# Patient Record
Sex: Female | Born: 1980 | Race: Black or African American | Hispanic: No | Marital: Single | Smoking: Never smoker
Health system: Southern US, Community
[De-identification: ages and names within clinical notes are randomized; demographics above are authoritative.]

## PROBLEM LIST (undated history)

## (undated) DIAGNOSIS — R5383 Other fatigue: Secondary | ICD-10-CM

## (undated) DIAGNOSIS — R519 Headache, unspecified: Secondary | ICD-10-CM

## (undated) DIAGNOSIS — R51 Headache: Secondary | ICD-10-CM

## (undated) DIAGNOSIS — G35 Multiple sclerosis: Principal | ICD-10-CM

## (undated) DIAGNOSIS — L659 Nonscarring hair loss, unspecified: Secondary | ICD-10-CM

## (undated) HISTORY — DX: Nonscarring hair loss, unspecified: L65.9

## (undated) HISTORY — DX: Headache: R51

## (undated) HISTORY — DX: Multiple sclerosis: G35

## (undated) HISTORY — DX: Headache, unspecified: R51.9

## (undated) HISTORY — PX: OVARY SURGERY: SHX727

## (undated) HISTORY — DX: Other fatigue: R53.83

---

## 2008-10-16 DIAGNOSIS — G35D Multiple sclerosis, unspecified: Secondary | ICD-10-CM

## 2008-10-16 DIAGNOSIS — G35 Multiple sclerosis: Secondary | ICD-10-CM

## 2008-10-16 HISTORY — DX: Multiple sclerosis, unspecified: G35.D

## 2008-10-16 HISTORY — DX: Multiple sclerosis: G35

## 2013-02-14 ENCOUNTER — Encounter: Payer: Self-pay | Admitting: Internal Medicine

## 2013-02-14 ENCOUNTER — Other Ambulatory Visit (INDEPENDENT_AMBULATORY_CARE_PROVIDER_SITE_OTHER): Payer: 59

## 2013-02-14 ENCOUNTER — Ambulatory Visit (INDEPENDENT_AMBULATORY_CARE_PROVIDER_SITE_OTHER): Payer: 59 | Admitting: Internal Medicine

## 2013-02-14 VITALS — BP 140/80 | HR 108 | Temp 97.7°F | Resp 16 | Wt 124.0 lb

## 2013-02-14 DIAGNOSIS — R209 Unspecified disturbances of skin sensation: Secondary | ICD-10-CM

## 2013-02-14 DIAGNOSIS — M6281 Muscle weakness (generalized): Secondary | ICD-10-CM

## 2013-02-14 DIAGNOSIS — R531 Weakness: Secondary | ICD-10-CM | POA: Insufficient documentation

## 2013-02-14 DIAGNOSIS — R202 Paresthesia of skin: Secondary | ICD-10-CM | POA: Insufficient documentation

## 2013-02-14 DIAGNOSIS — G35 Multiple sclerosis: Secondary | ICD-10-CM

## 2013-02-14 DIAGNOSIS — J309 Allergic rhinitis, unspecified: Secondary | ICD-10-CM

## 2013-02-14 DIAGNOSIS — Z91018 Allergy to other foods: Secondary | ICD-10-CM

## 2013-02-14 LAB — URINALYSIS, ROUTINE W REFLEX MICROSCOPIC
Bilirubin Urine: NEGATIVE
Ketones, ur: NEGATIVE
Nitrite: NEGATIVE
Specific Gravity, Urine: >=1.03 — AB (ref 1.000–1.030)
Urine Glucose: NEGATIVE
Urobilinogen, UA: 0.2 (ref 0.0–1.0)
pH: 5.5 (ref 5.0–8.0)

## 2013-02-14 LAB — CBC WITH DIFFERENTIAL/PLATELET
Basophils Absolute: 0 10*3/uL (ref 0.0–0.1)
Basophils Relative: 0.4 % (ref 0.0–3.0)
Eosinophils Absolute: 0 10*3/uL (ref 0.0–0.7)
Eosinophils Relative: 0.4 % (ref 0.0–5.0)
HCT: 39.9 % (ref 36.0–46.0)
Hemoglobin: 12.9 g/dL (ref 12.0–15.0)
Lymphocytes Relative: 39 % (ref 12.0–46.0)
Lymphs Abs: 2.3 10*3/uL (ref 0.7–4.0)
MCHC: 32.3 g/dL (ref 30.0–36.0)
MCV: 96.2 fl (ref 78.0–100.0)
Monocytes Absolute: 0.4 10*3/uL (ref 0.1–1.0)
Monocytes Relative: 7.5 % (ref 3.0–12.0)
Neutro Abs: 3.1 10*3/uL (ref 1.4–7.7)
Neutrophils Relative %: 52.7 % (ref 43.0–77.0)
Platelets: 315 10*3/uL (ref 150.0–400.0)
RBC: 4.15 Mil/uL (ref 3.87–5.11)
RDW: 12.7 % (ref 11.5–14.6)
WBC: 5.8 10*3/uL (ref 4.5–10.5)

## 2013-02-14 LAB — BASIC METABOLIC PANEL
BUN: 13 mg/dL (ref 6–23)
CO2: 23 mEq/L (ref 19–32)
Calcium: 10.1 mg/dL (ref 8.4–10.5)
Chloride: 113 mEq/L — ABNORMAL HIGH (ref 96–112)
Creatinine, Ser: 0.7 mg/dL (ref 0.4–1.2)
GFR: 126.11 mL/min (ref >60.00)
Glucose, Bld: 83 mg/dL (ref 70–99)
Potassium: 4.5 mEq/L (ref 3.5–5.1)
Sodium: 150 mEq/L — ABNORMAL HIGH (ref 135–145)

## 2013-02-14 LAB — SEDIMENTATION RATE: Sed Rate: 22 mm/hr (ref 0–22)

## 2013-02-14 LAB — TSH: TSH: 1.93 u[IU]/mL (ref 0.35–5.50)

## 2013-02-14 LAB — HEPATIC FUNCTION PANEL
ALT: 16 U/L (ref 0–35)
AST: 23 U/L (ref 0–37)
Albumin: 4.5 g/dL (ref 3.5–5.2)
Alkaline Phosphatase: 36 U/L — ABNORMAL LOW (ref 39–117)
Bilirubin, Direct: 0 mg/dL (ref 0.0–0.3)
Total Bilirubin: 0.5 mg/dL (ref 0.3–1.2)
Total Protein: 8.4 g/dL — ABNORMAL HIGH (ref 6.0–8.3)

## 2013-02-14 LAB — VITAMIN B12: Vitamin B-12: 472 pg/mL (ref 211–911)

## 2013-02-14 LAB — MAGNESIUM: Magnesium: 2 mg/dL (ref 1.5–2.5)

## 2013-02-14 MED ORDER — LORATADINE 10 MG PO TABS: 10.0000 mg | ORAL_TABLET | Freq: Every day | ORAL | Status: AC

## 2013-02-14 MED ORDER — CEFUROXIME AXETIL 250 MG PO TABS: 250.0000 mg | ORAL_TABLET | Freq: Two times a day (BID) | ORAL | Status: DC

## 2013-02-14 NOTE — Assessment & Plan Note (Addendum)
MRI Labs Will get records

## 2013-02-14 NOTE — Progress Notes (Signed)
Pre visit review using our clinic review tool, if applicable. No additional management support is needed unless otherwise documented below in the visit note. 

## 2013-02-14 NOTE — Assessment & Plan Note (Signed)
Labs Claritin

## 2013-02-14 NOTE — Assessment & Plan Note (Signed)
[] 

## 2013-02-14 NOTE — Progress Notes (Signed)
   Subjective:    HPI  New pt  L handed 33 yo pt with a h/o relapse-remitting MS c/o R sided body numbness and weakness (chronic - worse a little lately) and  a HA x1 mo - she was in the hosp w/MS x 1 wk in 2010 in [place]   Review of Systems  Constitutional: Positive for fatigue. Negative for fever, chills, diaphoresis, activity change, appetite change and unexpected weight change.  HENT: Positive for congestion and rhinorrhea. Negative for dental problem, ear pain, hearing loss, mouth sores, postnasal drip, sinus pressure, sneezing, sore throat and voice change.   Eyes: Negative for pain and visual disturbance.  Respiratory: Negative for cough, chest tightness, wheezing and stridor.   Cardiovascular: Negative for chest pain, palpitations and leg swelling.  Gastrointestinal: Negative for nausea, vomiting, abdominal pain, diarrhea, blood in stool, abdominal distention and rectal pain.  Genitourinary: Negative for dysuria, hematuria, decreased urine volume, vaginal bleeding, vaginal discharge, difficulty urinating, vaginal pain and menstrual problem.  Musculoskeletal: Negative for back pain, gait problem, joint swelling and neck pain.  Skin: Negative for color change, rash and wound.  Allergic/Immunologic: Positive for environmental allergies. Negative for food allergies and immunocompromised state.  Neurological: Positive for weakness and numbness. Negative for dizziness, tremors, syncope, speech difficulty and light-headedness.  Hematological: Negative for adenopathy.  Psychiatric/Behavioral: Negative for suicidal ideas, hallucinations, behavioral problems, confusion, sleep disturbance, dysphoric mood and decreased concentration. The patient is not nervous/anxious and is not hyperactive.        Objective:   Physical Exam  Constitutional: She is oriented to person, place, and time. She appears well-developed. No distress.  HENT:  Head: Normocephalic.  Right Ear: External ear normal.   Left Ear: External ear normal.  Nose: Nose normal.  Mouth/Throat: Oropharynx is clear and moist.  Eyes: Conjunctivae are normal. Pupils are equal, round, and reactive to light. Right eye exhibits no discharge. Left eye exhibits no discharge.  Neck: Normal range of motion. Neck supple. No JVD present. No tracheal deviation present. No thyromegaly present.  Cardiovascular: Normal rate, regular rhythm and normal heart sounds.   Pulmonary/Chest: No stridor. No respiratory distress. She has no wheezes.  Abdominal: Soft. Bowel sounds are normal. She exhibits no distension and no mass. There is no tenderness. There is no rebound and no guarding.  Musculoskeletal: She exhibits no edema and no tenderness.  Lymphadenopathy:    She has no cervical adenopathy.  Neurological: She is alert and oriented to person, place, and time. She displays normal reflexes. No cranial nerve deficit. She exhibits normal muscle tone. Coordination normal.  R UE and R LE MS 5-/5, grip nl B DTRs  Decreased on R  Skin: No rash noted. She is not diaphoretic. No erythema.  Psychiatric: She has a normal mood and affect. Her behavior is normal. Judgment and thought content normal.          Assessment & Plan:

## 2013-02-14 NOTE — Patient Instructions (Signed)
Gluten free trial (no wheat products) for 4-6 weeks. OK to use gluten-free bread and gluten-free pasta.  Milk free trial (no milk, ice cream, cheese and yogurt) for 4-6 weeks. OK to use almond, coconut, rice or soy milk. "Almond breeze" brand tastes good.  

## 2013-02-14 NOTE — Assessment & Plan Note (Signed)
MRI

## 2013-02-17 LAB — ALLERGEN FOOD PROFILE SPECIFIC IGE
Apple: 0.45 kU/L — ABNORMAL HIGH
Chicken IgE: <0.1 kU/L
Corn: 0.94 kU/L — ABNORMAL HIGH
Egg White IgE: <0.1 kU/L
Fish Cod: <0.1 kU/L
IgE (Immunoglobulin E), Serum: 68.2 IU/mL (ref 0.0–180.0)
Milk IgE: <0.1 kU/L
Orange: 0.55 kU/L — ABNORMAL HIGH
Peanut IgE: 2.8 kU/L — ABNORMAL HIGH
Shrimp IgE: <0.1 kU/L
Soybean IgE: 0.45 kU/L — ABNORMAL HIGH
Tomato IgE: 4.23 kU/L — ABNORMAL HIGH
Tuna IgE: <0.1 kU/L
Wheat IgE: 0.4 kU/L — ABNORMAL HIGH

## 2013-02-17 LAB — ALLERGY PROFILE REGION II-DC, DE, MD, ~~LOC~~, VA
Allergen, D pternoyssinus,d7: <0.1 kU/L
Alternaria Alternata: 1.32 kU/L — ABNORMAL HIGH
Aspergillus fumigatus, m3: 0.38 kU/L — ABNORMAL HIGH
Bermuda Grass: 18.6 kU/L — ABNORMAL HIGH
Box Elder IgE: 16.6 kU/L — ABNORMAL HIGH
Cat Dander: <0.1 kU/L
Cladosporium Herbarum: 0.29 kU/L — ABNORMAL HIGH
Cockroach: <0.1 kU/L
Common Ragweed: 16.9 kU/L — ABNORMAL HIGH
D. farinae: 0.1 kU/L — ABNORMAL HIGH
Dog Dander: <0.1 kU/L
Elm IgE: 17.2 kU/L — ABNORMAL HIGH
Johnson Grass: 5.72 kU/L — ABNORMAL HIGH
Lamb's Quarters: 9.37 kU/L — ABNORMAL HIGH
Meadow Grass: 14.8 kU/L — ABNORMAL HIGH
Oak: 9.87 kU/L — ABNORMAL HIGH
Pecan/Hickory Tree IgE: 11.7 kU/L — ABNORMAL HIGH

## 2013-02-18 DIAGNOSIS — Z91018 Allergy to other foods: Secondary | ICD-10-CM | POA: Insufficient documentation

## 2013-02-18 NOTE — Assessment & Plan Note (Signed)
Discussed diet  

## 2013-02-27 ENCOUNTER — Encounter: Payer: Self-pay | Admitting: Internal Medicine

## 2013-02-27 ENCOUNTER — Ambulatory Visit (HOSPITAL_COMMUNITY): Admission: RE | Admit: 2013-02-27 | Payer: 59 | Source: Ambulatory Visit

## 2013-02-27 ENCOUNTER — Ambulatory Visit (HOSPITAL_COMMUNITY)
Admission: RE | Admit: 2013-02-27 | Discharge: 2013-02-27 | Disposition: A | Discharge status: Home / Self Care | Payer: 59 | Source: Ambulatory Visit | Attending: Internal Medicine | Admitting: Internal Medicine

## 2013-02-27 ENCOUNTER — Other Ambulatory Visit: Payer: Self-pay | Admitting: Internal Medicine

## 2013-02-27 DIAGNOSIS — G35 Multiple sclerosis: Secondary | ICD-10-CM

## 2013-02-27 DIAGNOSIS — R202 Paresthesia of skin: Secondary | ICD-10-CM

## 2013-02-27 DIAGNOSIS — R531 Weakness: Secondary | ICD-10-CM

## 2013-02-27 DIAGNOSIS — R29898 Other symptoms and signs involving the musculoskeletal system: Secondary | ICD-10-CM | POA: Insufficient documentation

## 2013-02-27 MED ORDER — GADOBENATE DIMEGLUMINE 529 MG/ML IV SOLN
11.0000 mL | Freq: Once | INTRAVENOUS | Status: AC | PRN
Start: 2013-02-27 — End: 2013-02-27
  Administered 2013-02-27: 11 mL via INTRAVENOUS

## 2013-03-25 ENCOUNTER — Encounter: Payer: Self-pay | Admitting: *Deleted

## 2013-03-26 ENCOUNTER — Ambulatory Visit (INDEPENDENT_AMBULATORY_CARE_PROVIDER_SITE_OTHER): Payer: 59 | Admitting: Neurology

## 2013-03-26 ENCOUNTER — Encounter: Payer: Self-pay | Admitting: Neurology

## 2013-03-26 VITALS — BP 120/70 | HR 124 | Temp 98.5°F | Ht 64.96 in | Wt 128.2 lb

## 2013-03-26 DIAGNOSIS — G35 Multiple sclerosis: Secondary | ICD-10-CM

## 2013-03-26 NOTE — Patient Instructions (Signed)
1.  Start taking vitamin D 2000units daily 2.  I would like to see you back in 71-months, or sooner as needed

## 2013-03-26 NOTE — Progress Notes (Signed)
a [facility] Clinic Note - Initial Visit   Date: 03/26/2013    Shelby Navarro MRN: [id number] DOB: 02-08-1980   Dear Dr Alain Marion:  Thank you for your kind referral of Shelby Navarro for consultation of multiple sclerosis. Although her history is well known to you, please allow [name] to reiterate it for the purpose of our medical record. The patient was accompanied to the clinic by self.   History of Present Illness: Shelby Navarro is a 33 y.o. left-handed African American female with history of multiple sclerosis (diagnosed 2010) presenting to establish care for MS.  Patient moved to [facility] from [place] in 2013 to be closer to family and has not seen a neurologist since her previous neurologist, Dr. [name], in 2011.  She was diagnosed with MS in 2010 at which time she developed right-sided weakness, incoordination, numbness which had started in the summer and progressed over the next 11-months. She went to the Emergency Department in October because of severe headaches and right arm and leg pain at which time MRI showed two left hemispheric areas of T2 hyperintensity with minimal enhancement and enhancing C2 cord lesion.  She underwent lumbar puncture which showed elevated IgG, myelin basic protein, and presence of oligoclonal bands consistent with multiple sclerosis, and completed 5-day IVMP 1000mg .  She was discharged home and with out-patient therapy and within one month, she returned nearly back to her baseline.  She had mild residual right hand numbness/tingling which resolved several months later.  Subsequent imaging that showed right parietal lesion was larger in size, but remained non-enhancing and the remaining lesions were unchanged.  Because she was clinically stable, immunomodulatory therapy was not started.  She has been doing well since then so has not seen a neurologist.  No history of vision loss/changes, bowel/bladder problems, or weakness.  However,  since early 2015, she developed fatigue, intermittent headaches, and right-sided arm pain.  Pain initially started in her neck and within a few weeks, developed right upper arm throbbing pain and numbness similar to what she experienced in 2010.  She was recently treated for UTI and her symptoms have nearly resolved since completing antibiotics.  MRI brain and cervical spine performed on 02/27/2013 shows no new or enhancing brain lesions.  Out-side paper records, electronic medical record, and images have been reviewed where available and summarized as:  Internal data: MRI brain and cervical spine wwo contrast 02/27/2013: 1. Cerebral white matter disease consistent with history of multiple sclerosis. None of the plaques enhance or restrict diffusion.  2. Single cervical cord plaque, located in the posterior right cord at C2.   External data: MRI brain wwo 05/07/2009:  Changing appearance of demyelinating lesions, worsened lesion in the right parietal lobe, improved lesion in the left posterior frontal was first restricted diffusion, otherwise the periventricular demyelinating changes remain stable. MRI cervical spine with and without contrast 05/07/2009:  See to dorsal column demyelinating plaque, stable size, but no potlucks activity, no enhancement and no new lesions in the cervical or thoracic spinal cord. Labs 12/07/2008:  NMO, ENA -  negative, SPEP - borderline polyclonal gammopathy CSF 11/14/2008:  W3 R <1000 G63, P14  IgG and MPB elevated, OCB +  CSF cytology, ACE, VDRL, ACE - neg   Past Medical History  Diagnosis Date  . Multiple sclerosis 10/2008  . Alopecia   . Headache   . Fatigue     Past Surgical History  Procedure Laterality Date  . Ovary surgery  Medications:  Current Outpatient Prescriptions on File Prior to Visit  Medication Sig Dispense Refill  . loratadine (CLARITIN) 10 MG tablet Take 1 tablet (10 mg total) by mouth daily.  100 tablet  3  . norgestimate-ethinyl  estradiol (ORTHO-CYCLEN,SPRINTEC,PREVIFEM) 0.25-35 MG-MCG tablet Take 1 tablet by mouth daily.       No current facility-administered medications on file prior to visit.    Allergies: No Known Allergies  Family History: Family History  Problem Relation Age of Onset  . Hypertension Mother   . Lupus Sister     Social History: History   Social History  . Marital Status: Single    Spouse Name: N/A    Number of Children: 2  . Years of Education: N/A   Occupational History  .  [employer]   Social History Main Topics  . Smoking status: Never Smoker   . Smokeless tobacco: Not on file  . Alcohol Use: Not on file  . Drug Use: Not on file  . Sexual Activity: Not on file   Other Topics Concern  . Not on file   Social History Narrative  . No narrative on file    Review of Systems:  CONSTITUTIONAL: No fevers, chills, night sweats, or weight loss.  +malaise EYES: No visual changes or eye pain ENT: No hearing changes.  No history of nose bleeds.   RESPIRATORY: No cough, wheezing and shortness of breath.   CARDIOVASCULAR: Negative for chest pain, and palpitations.   GI: Negative for abdominal discomfort, blood in stools or black stools.  No recent change in bowel habits.   GU:  No history of incontinence.   MUSCLOSKELETAL: No history of joint pain or swelling.  No myalgias.   SKIN: Negative for lesions, rash, and itching.   HEMATOLOGY/ONCOLOGY: Negative for prolonged bleeding, bruising easily, and swollen nodes. .   ENDOCRINE: Negative for cold or heat intolerance, polydipsia or goiter.   PSYCH:  No depression or anxiety symptoms.   NEURO: As Above.   Vital Signs:  BP 120/70  Pulse 124  Temp(Src) 98.5 F (36.9 C)  Ht 5' 4.96" (1.65 m)  Wt 128 lb 4 oz (58.174 kg)  BMI 21.37 kg/m2  SpO2 99%  LMP 01/27/2013 Pain Scale: 0 on a scale of 0-10   General Medical Exam:   General:  Well appearing, comfortable.   Eyes/ENT: see cranial nerve examination.   Neck:  No masses appreciated.  Full range of motion without tenderness.  No carotid bruits. Respiratory:  Clear to auscultation, good air entry bilaterally.   Cardiac:  Regular rate and rhythm, no murmur.   Extremities:  No deformities, edema, or skin discoloration. Good capillary refill.   Skin:  Skin color, texture, turgor normal. No rashes or lesions.  Neurological Exam: MENTAL STATUS including orientation to time, place, person, recent and remote memory, attention span and concentration, language, and fund of knowledge is normal.  Speech is not dysarthric.  CRANIAL NERVES: II:  No visual field defects.  Unremarkable fundi, no optic atrophy.   III-IV-VI: Pupils equal round and reactive to light. No APD. Normal conjugate, extra-ocular eye movements in all directions of gaze.  No nystagmus.  No ptosis.   V:  Normal facial sensation.     VII:  Normal facial symmetry and movements.  No pathologic facial reflexes.  VIII:  Normal hearing and vestibular function.   IX-X:  Normal palatal movement.   XI:  Normal shoulder shrug and head rotation.   XII:  Normal  tongue strength and range of motion, no deviation or fasciculation.  MOTOR:  No atrophy, fasciculations or abnormal movements.  No pronator drift.  Tone is normal.    Right Upper Extremity:    Left Upper Extremity:    Deltoid  5/5   Deltoid  5/5   Biceps  5/5   Biceps  5/5   Triceps  5/5   Triceps  5/5   Wrist extensors  5/5   Wrist extensors  5/5   Wrist flexors  5/5   Wrist flexors  5/5   Finger extensors  5/5   Finger extensors  5/5   Finger flexors  5/5   Finger flexors  5/5   Dorsal interossei  5/5   Dorsal interossei  5/5   Abductor pollicis  5/5   Abductor pollicis  5/5   Tone (Ashworth scale)  0  Tone (Ashworth scale)  0   Right Lower Extremity:    Left Lower Extremity:    Hip flexors  5/5   Hip flexors  5/5   Hip extensors  5/5   Hip extensors  5/5   Knee flexors  5/5   Knee flexors  5/5   Knee extensors  5/5   Knee extensors   5/5   Dorsiflexors  5/5   Dorsiflexors  5/5   Plantarflexors  5/5   Plantarflexors  5/5   Toe extensors  5/5   Toe extensors  5/5   Toe flexors  5/5   Toe flexors  5/5   Tone (Ashworth scale)  0  Tone (Ashworth scale)  0   MSRs:  Right                                                                 Left brachioradialis 2+  brachioradialis 2+  biceps 2+  biceps 2+  triceps 2+  triceps 2+  patellar 2+  patellar 3+  ankle jerk 2+  ankle jerk 2+  Hoffman no  Hoffman no  plantar response down  plantar response down   SENSORY:  Normal and symmetric perception of light touch, pinprick, vibration, and proprioception.  Romberg's sign absent.   COORDINATION/GAIT: Normal finger-to- nose-finger and heel-to-shin.  Intact rapid alternating movements bilaterally.  Able to rise from a chair without using arms.  Gait narrow based and stable. Tandem and stressed gait intact.    IMPRESSION: Ms. Doss is a delightful 33 year-old female presenting to establish care for multiple sclerosis.  Her exam is notable for brisk patella jerk on the left only.  Motor strength, sensation, and tone is normal.  I have personally reviewed the images with patient which shows T2 hyperintensity involving the right occipital horn/periventricular, left parietal subcortical region, and cervical cord at C2, none of which are enhancing.  Given that symptoms have improved after completing treatment for UTI, I suspect that her right sided discomfort was due to unmasking of old symptoms from stress related to infection, and not due to MS exacerbation.   If fatigue remains a problem, may consider start provigil going forward.  I explained that I would have a low threshold to start immunomodulatory treatment, if she has new symptoms or lesions on imaging.  She would like to stay off medications at this time, which is reasonable  given her stable course since disease onset.  On a side note, her heart rate is elevated today and was checked  several times by myself (HR 120s).  She is asymptomatic, but will ask Dr. Alain Marion to evaluate.     PLAN/RECOMMENDATIONS:  1.  Start vitamin D 2000 units 2.  Asymptomatic with sinus tachycardia (HR 120s), will ask PCP to evaluate 3.  Return to clinic in 67-months, or sooner as needed   The duration of this appointment visit was 50 minutes of face-to-face time with the patient.  Greater than 50% of this time was spent in counseling, explanation of diagnosis, planning of further management, and coordination of care.   Thank you for allowing me to participate in patient's care.  If I can answer any additional questions, I would be pleased to do so.    Sincerely,    Deriana Vanderhoef K. Posey Pronto, DO

## 2013-03-27 ENCOUNTER — Encounter: Payer: Self-pay | Admitting: Internal Medicine

## 2013-03-28 ENCOUNTER — Other Ambulatory Visit: Payer: Self-pay | Admitting: Internal Medicine

## 2013-03-28 MED ORDER — VITAMIN D 50 MCG (2000 UT) PO TABS: 2000.0000 [IU] | ORAL_TABLET | Freq: Every day | ORAL | Status: AC

## 2013-04-02 ENCOUNTER — Other Ambulatory Visit: Payer: Self-pay | Admitting: Internal Medicine

## 2013-04-02 DIAGNOSIS — R000 Tachycardia, unspecified: Secondary | ICD-10-CM | POA: Insufficient documentation

## 2013-04-02 NOTE — Assessment & Plan Note (Signed)
Card ref - Dr Ross 

## 2013-05-09 ENCOUNTER — Institutional Professional Consult (permissible substitution): Payer: 59 | Admitting: Internal Medicine

## 2013-06-02 ENCOUNTER — Encounter: Payer: Self-pay | Admitting: Internal Medicine

## 2013-06-02 ENCOUNTER — Other Ambulatory Visit: Payer: Self-pay | Admitting: Internal Medicine

## 2013-06-02 MED ORDER — POLYMYXIN B-TRIMETHOPRIM 10000-0.1 UNIT/ML-% OP SOLN: 1.0000 [drp] | Freq: Four times a day (QID) | OPHTHALMIC | Status: DC

## 2013-09-08 ENCOUNTER — Other Ambulatory Visit: Payer: Self-pay | Admitting: Internal Medicine

## 2013-09-08 ENCOUNTER — Encounter: Payer: Self-pay | Admitting: Internal Medicine

## 2013-09-08 MED ORDER — CEFUROXIME AXETIL 250 MG PO TABS: 250.0000 mg | ORAL_TABLET | Freq: Two times a day (BID) | ORAL | Status: DC

## 2013-09-26 ENCOUNTER — Ambulatory Visit: Payer: 59 | Admitting: Neurology

## 2013-09-29 ENCOUNTER — Telehealth: Payer: Self-pay | Admitting: Neurology

## 2013-09-29 NOTE — Telephone Encounter (Signed)
Pt cancelled appt for 09-26-13 and did not resch

## 2015-02-08 MED FILL — MONO-LINYAH 28 TABLET: 0.25-35 | 84 days supply | Qty: 112 | Fill #3

## 2015-06-03 MED FILL — MONO-LINYAH 28 TABLET: 0.25-35 | 21 days supply | Qty: 28 | Fill #0

## 2015-06-04 DIAGNOSIS — Z6821 Body mass index (BMI) 21.0-21.9, adult: Secondary | ICD-10-CM | POA: Diagnosis not present

## 2015-06-04 DIAGNOSIS — Z01419 Encounter for gynecological examination (general) (routine) without abnormal findings: Secondary | ICD-10-CM | POA: Diagnosis not present

## 2015-06-23 DIAGNOSIS — Z0000 Encounter for general adult medical examination without abnormal findings: Secondary | ICD-10-CM | POA: Diagnosis not present

## 2015-07-05 MED FILL — MONO-LINYAH 28 TABLET: 0.25-35 | 84 days supply | Qty: 84 | Fill #0

## 2015-08-03 MED FILL — VALACYCLOVIR HCL 500 MG TAB: 500 | 90 days supply | Qty: 90 | Fill #0

## 2015-09-17 ENCOUNTER — Encounter (HOSPITAL_COMMUNITY): Payer: Self-pay | Admitting: Family Medicine

## 2015-09-17 ENCOUNTER — Ambulatory Visit (HOSPITAL_COMMUNITY)
Admission: EM | Admit: 2015-09-17 | Discharge: 2015-09-17 | Disposition: A | Discharge status: Home / Self Care | Payer: 59 | Attending: Physician Assistant | Admitting: Physician Assistant

## 2015-09-17 DIAGNOSIS — L03115 Cellulitis of right lower limb: Secondary | ICD-10-CM | POA: Diagnosis not present

## 2015-09-17 DIAGNOSIS — T148 Other injury of unspecified body region: Secondary | ICD-10-CM | POA: Diagnosis not present

## 2015-09-17 DIAGNOSIS — W57XXXA Bitten or stung by nonvenomous insect and other nonvenomous arthropods, initial encounter: Secondary | ICD-10-CM | POA: Diagnosis not present

## 2015-09-17 MED ORDER — METHYLPREDNISOLONE ACETATE 80 MG/ML IJ SUSP
INTRAMUSCULAR | Status: AC
  Filled 2015-09-17: qty 1

## 2015-09-17 MED ORDER — METHYLPREDNISOLONE ACETATE 80 MG/ML IJ SUSP
80.0000 mg | Freq: Once | INTRAMUSCULAR | Status: AC
  Administered 2015-09-17: 80 mg via INTRAMUSCULAR

## 2015-09-17 MED ORDER — CEPHALEXIN 500 MG PO CAPS: 500.0000 mg | ORAL_CAPSULE | Freq: Two times a day (BID) | ORAL | 0 refills | Status: AC

## 2015-09-17 MED FILL — CEPHALEXIN 500 MG CAPSULE: 500 | 10 days supply | Qty: 20 | Fill #0

## 2015-09-17 NOTE — Discharge Instructions (Signed)
You are having an inflammatory response to an insect bite. I doubt infection at this point, but with warmth and swelling will cover with an antibiotic. Keep elevated and iced. If worsens or start having symptoms of fevers then go to the ER.

## 2015-09-17 NOTE — ED Provider Notes (Signed)
CSN: [id number]     Arrival date & time 09/17/15  1130 History   First MD Initiated Contact with Patient 09/17/15 1252     Chief Complaint  Patient presents with  . Foot Pain   (Consider location/radiation/quality/duration/timing/severity/associated sxs/prior Treatment) Patient presents with swelling and redness to her right ankle. She remembers getting bit by something Monday while working in the yard. Following the bite her ankle started swelling and has worsened since that time. She was unable to get a shoe on today. No fever or chills.       Past Medical History:  Diagnosis Date  . Alopecia   . Fatigue   . Headache   . Multiple sclerosis ([place]) 10/2008   Past Surgical History:  Procedure Laterality Date  . OVARY SURGERY     Family History  Problem Relation Age of Onset  . Hypertension Mother   . Healthy Father   . Lupus Sister   . Colon cancer Paternal Aunt   . Healthy Son   . Healthy Daughter    Social History  Substance Use Topics  . Smoking status: Never Smoker  . Smokeless tobacco: Never Used  . Alcohol use No   OB History    No data available     Review of Systems  Constitutional: Negative for fever.  Skin: Positive for color change.    Allergies  Review of patient's allergies indicates no known allergies.  Home Medications   Prior to Admission medications   Medication Sig Start Date End Date Taking? Authorizing Provider  cephALEXin (KEFLEX) 500 MG capsule Take 1 capsule (500 mg total) by mouth 2 (two) times daily. 09/17/15   Bjorn Pippin, PA-C  Cholecalciferol (VITAMIN D) 2000 UNITS tablet Take 1 tablet (2,000 Units total) by mouth daily. 03/28/13   Evie Lacks Plotnikov, MD  loratadine (CLARITIN) 10 MG tablet Take 1 tablet (10 mg total) by mouth daily. 02/14/13   Cassandria Anger, MD  norgestimate-ethinyl estradiol (ORTHO-CYCLEN,SPRINTEC,PREVIFEM) 0.25-35 MG-MCG tablet Take 1 tablet by mouth daily.    Historical Provider, MD   Meds Ordered and  Administered this Visit   Medications  methylPREDNISolone acetate (DEPO-MEDROL) injection 80 mg (not administered)    BP 127/88   Pulse 90   Temp 98.2 F (36.8 C) (Oral)   Resp 18   LMP 09/03/2015   SpO2 100%  No data found.   Physical Exam  Constitutional: She is oriented to person, place, and time. She appears well-developed and well-nourished. No distress.  HENT:  Head: Normocephalic and atraumatic.  Pulmonary/Chest: Effort normal.  Neurological: She is alert and oriented to person, place, and time.  Skin: Skin is warm and dry. She is not diaphoretic. There is erythema.  Right ankle with 1 hive and surrounding erythema, warmth and edema. No open areas are noted  Psychiatric: Her behavior is normal.    Urgent Care Course   Clinical Course    Procedures (including critical care time)  Labs Review Labs Reviewed - No data to display  Imaging Review No results found.   Visual Acuity Review  Right Eye Distance:   Left Eye Distance:   Bilateral Distance:    Right Eye Near:   Left Eye Near:    Bilateral Near:         MDM   1. Cellulitis of right lower extremity   2. Insect bite    Cover for infection though less doubtful. Suspect inflammatory response. Treat with IM DepoMedrol 80mg . If worsening signs of  infection go to the ED.     Bjorn Pippin, PA-C 09/17/15 1313

## 2015-09-17 NOTE — ED Triage Notes (Signed)
Pt here for right foot pain and swelling that started after working in the yard Monday. sts she believes she was bit by something. Denies any injury. Pt foot very swollen.

## 2015-09-28 MED FILL — MONO-LINYAH 28 TABLET: 0.25-35 | 84 days supply | Qty: 84 | Fill #1

## 2015-12-20 MED FILL — MONO-LINYAH 28 TABLET: 0.25-35 | 84 days supply | Qty: 84 | Fill #2

## 2016-02-10 MED FILL — VALACYCLOVIR HCL 500 MG TAB: 500 | 90 days supply | Qty: 90 | Fill #1

## 2016-02-19 DIAGNOSIS — H5203 Hypermetropia, bilateral: Secondary | ICD-10-CM | POA: Diagnosis not present

## 2016-03-13 MED FILL — MONO-LINYAH 28 TABLET: 0.25-35 | 84 days supply | Qty: 84 | Fill #3

## 2016-06-07 MED FILL — NORG-ETHIN ESTRA 0.25-0.035: 0.25-35 | 84 days supply | Qty: 84 | Fill #0

## 2016-06-27 DIAGNOSIS — Z01419 Encounter for gynecological examination (general) (routine) without abnormal findings: Secondary | ICD-10-CM | POA: Diagnosis not present

## 2016-07-06 MED FILL — VALACYCLOVIR HCL 500 MG TAB: 500 | 90 days supply | Qty: 90 | Fill #0

## 2016-08-10 DIAGNOSIS — Z0000 Encounter for general adult medical examination without abnormal findings: Secondary | ICD-10-CM | POA: Diagnosis not present

## 2016-08-10 DIAGNOSIS — D259 Leiomyoma of uterus, unspecified: Secondary | ICD-10-CM | POA: Diagnosis not present

## 2016-09-20 ENCOUNTER — Other Ambulatory Visit: Payer: Self-pay | Admitting: Obstetrics and Gynecology

## 2016-09-20 NOTE — Patient Instructions (Signed)
Your procedure is scheduled on:  Wednesday, Sept. 12, 2018  Enter through the [employer] [facility] at:  7:00 AM  Pick up the phone at the desk and dial [phone].  Call this number if you have problems the morning of surgery: [phone].  Remember: Do NOT eat food or drink after:  Midnight Tuesday  Take these medicines the morning of surgery with a SIP OF WATER:  None  Stop ALL herbal medications and Vitamin C at this time  Do NOT smoke the day of surgery.  Do NOT wear jewelry (body piercing), metal hair clips/bobby pins, make-up, artifical eyelashes or nail polish. Do NOT wear lotions, powders, or perfumes.  You may wear deodorant. Do NOT shave for 48 hours prior to surgery. Do NOT bring valuables to the hospital. Contacts, dentures, or bridgework may not be worn into surgery.  Have a responsible adult drive you home and stay with you for 24 hours after your procedure  Bring a copy of your healthcare power of attorney and living will documents.

## 2016-09-20 NOTE — H&P (Signed)
36 y.o. [id number] desires permanent sterilization.    Past Medical History:  Diagnosis Date  . Alopecia   . Fatigue   . Headache   . Multiple sclerosis ([place]) 10/2008   Past Surgical History:  Procedure Laterality Date  . OVARY SURGERY      Social History   Social History  . Marital status: Single    Spouse name: N/A  . Number of children: 2  . Years of education: N/A   Occupational History  . [employer]   Social History Main Topics  . Smoking status: Never Smoker  . Smokeless tobacco: Never Used  . Alcohol use No  . Drug use: No  . Sexual activity: Not on file   Other Topics Concern  . Not on file   Social History Narrative   She lives with two children.   She is working as a Technical brewer.  Originally born in [place] and moved to [place] in 2013.    No current facility-administered medications on file prior to encounter.    Current Outpatient Prescriptions on File Prior to Encounter  Medication Sig Dispense Refill  . cephALEXin (KEFLEX) 500 MG capsule Take 1 capsule (500 mg total) by mouth 2 (two) times daily. 20 capsule 0  . Cholecalciferol (VITAMIN D) 2000 UNITS tablet Take 1 tablet (2,000 Units total) by mouth daily. 100 tablet 3  . loratadine (CLARITIN) 10 MG tablet Take 1 tablet (10 mg total) by mouth daily. 100 tablet 3  . norgestimate-ethinyl estradiol (ORTHO-CYCLEN,SPRINTEC,PREVIFEM) 0.25-35 MG-MCG tablet Take 1 tablet by mouth daily.      No Known Allergies  @VITALS2 @  Lungs: clear to ascultation Cor:  RRR Abdomen:  soft, nontender, nondistended. Ex:  no cords, erythema Pelvic:  NEFG, normal uterus  A:  Desires sterilization with filschie clips.   P: All risks, benefits and alternatives d/w patient and she desires to proceed.   Hyrum Shaneyfelt A

## 2016-09-21 ENCOUNTER — Encounter (HOSPITAL_COMMUNITY): Payer: Self-pay

## 2016-09-21 ENCOUNTER — Encounter (HOSPITAL_COMMUNITY)
Admission: RE | Admit: 2016-09-21 | Discharge: 2016-09-21 | Disposition: A | Discharge status: Home / Self Care | Payer: 59 | Source: Ambulatory Visit | Attending: Obstetrics and Gynecology | Admitting: Obstetrics and Gynecology

## 2016-09-21 DIAGNOSIS — Z01812 Encounter for preprocedural laboratory examination: Secondary | ICD-10-CM | POA: Diagnosis not present

## 2016-09-21 DIAGNOSIS — R531 Weakness: Secondary | ICD-10-CM | POA: Insufficient documentation

## 2016-09-21 DIAGNOSIS — J309 Allergic rhinitis, unspecified: Secondary | ICD-10-CM | POA: Diagnosis not present

## 2016-09-21 DIAGNOSIS — R000 Tachycardia, unspecified: Secondary | ICD-10-CM | POA: Insufficient documentation

## 2016-09-21 DIAGNOSIS — G35 Multiple sclerosis: Secondary | ICD-10-CM | POA: Diagnosis not present

## 2016-09-21 DIAGNOSIS — Z91018 Allergy to other foods: Secondary | ICD-10-CM | POA: Diagnosis not present

## 2016-09-21 DIAGNOSIS — R202 Paresthesia of skin: Secondary | ICD-10-CM | POA: Insufficient documentation

## 2016-09-21 LAB — CBC
HCT: 38 % (ref 36.0–46.0)
Hemoglobin: 12.8 g/dL (ref 12.0–15.0)
MCH: 31.1 pg (ref 26.0–34.0)
MCHC: 33.7 g/dL (ref 30.0–36.0)
MCV: 92.5 fL (ref 78.0–100.0)
Platelets: 300 10*3/uL (ref 150–400)
RBC: 4.11 MIL/uL (ref 3.87–5.11)
RDW: 12.4 % (ref 11.5–15.5)
WBC: 4.5 10*3/uL (ref 4.0–10.5)

## 2016-09-26 NOTE — Anesthesia Preprocedure Evaluation (Addendum)
Anesthesia Evaluation  Patient identified by MRN, date of birth, ID band Patient awake    Reviewed: Allergy & Precautions, NPO status , Patient's Chart, lab work & pertinent test results  History of Anesthesia Complications Negative for: history of anesthetic complications  Airway Mallampati: II  TM Distance: >3 FB Neck ROM: Full    Dental no notable dental hx. (+) Dental Advisory Given   Pulmonary neg pulmonary ROS,    Pulmonary exam normal        Cardiovascular negative cardio ROS Normal cardiovascular exam     Neuro/Psych MS negative psych ROS   GI/Hepatic negative GI ROS, Neg liver ROS,   Endo/Other  negative endocrine ROS  Renal/GU negative Renal ROS  negative genitourinary   Musculoskeletal negative musculoskeletal ROS (+)   Abdominal   Peds negative pediatric ROS (+)  Hematology negative hematology ROS (+)   Anesthesia Other Findings   Reproductive/Obstetrics negative OB ROS                            Anesthesia Physical Anesthesia Plan  ASA: II  Anesthesia Plan: General   Post-op Pain Management:    Induction: Intravenous  PONV Risk Score and Plan: 3 and Ondansetron, Dexamethasone and Scopolamine patch - Pre-op  Airway Management Planned: Oral ETT  Additional Equipment:   Intra-op Plan:   Post-operative Plan: Extubation in OR  Informed Consent: I have reviewed the patients History and Physical, chart, labs and discussed the procedure including the risks, benefits and alternatives for the proposed anesthesia with the patient or authorized representative who has indicated his/her understanding and acceptance.   Dental advisory given  Plan Discussed with: CRNA, Anesthesiologist and Surgeon  Anesthesia Plan Comments:        Anesthesia Quick Evaluation

## 2016-09-27 ENCOUNTER — Encounter (HOSPITAL_COMMUNITY): Payer: Self-pay

## 2016-09-27 ENCOUNTER — Ambulatory Visit (HOSPITAL_COMMUNITY): Payer: 59 | Admitting: Anesthesiology

## 2016-09-27 ENCOUNTER — Ambulatory Visit (HOSPITAL_COMMUNITY)
Admission: RE | Admit: 2016-09-27 | Discharge: 2016-09-27 | Disposition: A | Discharge status: Home / Self Care | Payer: 59 | Source: Ambulatory Visit | Attending: Obstetrics and Gynecology | Admitting: Obstetrics and Gynecology

## 2016-09-27 ENCOUNTER — Encounter (HOSPITAL_COMMUNITY)
Admission: RE | Disposition: A | Discharge status: Home / Self Care | Payer: Self-pay | Source: Ambulatory Visit | Attending: Obstetrics and Gynecology

## 2016-09-27 DIAGNOSIS — Z79899 Other long term (current) drug therapy: Secondary | ICD-10-CM | POA: Insufficient documentation

## 2016-09-27 DIAGNOSIS — G35 Multiple sclerosis: Secondary | ICD-10-CM | POA: Diagnosis not present

## 2016-09-27 DIAGNOSIS — Z302 Encounter for sterilization: Secondary | ICD-10-CM | POA: Diagnosis not present

## 2016-09-27 HISTORY — PX: LAPAROSCOPIC TUBAL LIGATION: SHX1937

## 2016-09-27 LAB — PREGNANCY, URINE: Preg Test, Ur: NEGATIVE

## 2016-09-27 SURGERY — LIGATION, FALLOPIAN TUBE, LAPAROSCOPIC
Anesthesia: General | Site: Abdomen | Laterality: Bilateral

## 2016-09-27 MED ORDER — SODIUM CHLORIDE 0.9 % IJ SOLN
INTRAMUSCULAR | Status: AC
  Filled 2016-09-27: qty 50

## 2016-09-27 MED ORDER — LIDOCAINE HCL (CARDIAC) 20 MG/ML IV SOLN
INTRAVENOUS | Status: DC | PRN
  Administered 2016-09-27: 40 mg via INTRAVENOUS

## 2016-09-27 MED ORDER — DEXAMETHASONE SODIUM PHOSPHATE 10 MG/ML IJ SOLN
INTRAMUSCULAR | Status: AC
  Filled 2016-09-27: qty 1

## 2016-09-27 MED ORDER — SUGAMMADEX SODIUM 200 MG/2ML IV SOLN
INTRAVENOUS | Status: AC
  Filled 2016-09-27: qty 2

## 2016-09-27 MED ORDER — SUGAMMADEX SODIUM 200 MG/2ML IV SOLN
INTRAVENOUS | Status: DC | PRN
  Administered 2016-09-27: 130 mg via INTRAVENOUS

## 2016-09-27 MED ORDER — SODIUM CHLORIDE 0.9 % IV SOLN
INTRAVENOUS | Status: DC | PRN
  Administered 2016-09-27: 60 mL

## 2016-09-27 MED ORDER — OXYCODONE-ACETAMINOPHEN 5-325 MG PO TABS: 1.0000 | ORAL_TABLET | ORAL | 0 refills | Status: AC | PRN

## 2016-09-27 MED ORDER — ROCURONIUM BROMIDE 100 MG/10ML IV SOLN
INTRAVENOUS | Status: DC | PRN
  Administered 2016-09-27: 30 mg via INTRAVENOUS

## 2016-09-27 MED ORDER — MIDAZOLAM HCL 2 MG/2ML IJ SOLN
INTRAMUSCULAR | Status: AC
  Filled 2016-09-27: qty 2

## 2016-09-27 MED ORDER — LIDOCAINE HCL (CARDIAC) 20 MG/ML IV SOLN
INTRAVENOUS | Status: AC
  Filled 2016-09-27: qty 5

## 2016-09-27 MED ORDER — OXYCODONE-ACETAMINOPHEN 5-325 MG PO TABS
ORAL_TABLET | ORAL | Status: AC
  Filled 2016-09-27: qty 1

## 2016-09-27 MED ORDER — MIDAZOLAM HCL 2 MG/2ML IJ SOLN
INTRAMUSCULAR | Status: DC | PRN
  Administered 2016-09-27: 1 mg via INTRAVENOUS

## 2016-09-27 MED ORDER — PROMETHAZINE HCL 25 MG/ML IJ SOLN
INTRAMUSCULAR | Status: AC
  Filled 2016-09-27: qty 1

## 2016-09-27 MED ORDER — SCOPOLAMINE 1 MG/3DAYS TD PT72
MEDICATED_PATCH | TRANSDERMAL | Status: AC
  Administered 2016-09-27: 1.5 mg via TRANSDERMAL
  Filled 2016-09-27: qty 1

## 2016-09-27 MED ORDER — ONDANSETRON HCL 4 MG/2ML IJ SOLN
INTRAMUSCULAR | Status: AC
  Filled 2016-09-27: qty 2

## 2016-09-27 MED ORDER — LACTATED RINGERS IV SOLN
INTRAVENOUS | Status: DC
  Administered 2016-09-27 (×2): via INTRAVENOUS

## 2016-09-27 MED ORDER — PROPOFOL 10 MG/ML IV BOLUS
INTRAVENOUS | Status: AC
  Filled 2016-09-27: qty 20

## 2016-09-27 MED ORDER — FENTANYL CITRATE (PF) 100 MCG/2ML IJ SOLN
INTRAMUSCULAR | Status: DC | PRN
  Administered 2016-09-27 (×2): 25 ug via INTRAVENOUS
  Administered 2016-09-27 (×2): 50 ug via INTRAVENOUS

## 2016-09-27 MED ORDER — ONDANSETRON HCL 4 MG/2ML IJ SOLN
INTRAMUSCULAR | Status: DC | PRN
  Administered 2016-09-27: 4 mg via INTRAVENOUS

## 2016-09-27 MED ORDER — PROMETHAZINE HCL 25 MG/ML IJ SOLN
6.2500 mg | INTRAMUSCULAR | Status: DC | PRN
  Administered 2016-09-27: 6.25 mg via INTRAVENOUS

## 2016-09-27 MED ORDER — KETOROLAC TROMETHAMINE 30 MG/ML IJ SOLN
INTRAMUSCULAR | Status: DC | PRN
  Administered 2016-09-27: 30 mg via INTRAVENOUS

## 2016-09-27 MED ORDER — ROCURONIUM BROMIDE 100 MG/10ML IV SOLN
INTRAVENOUS | Status: AC
  Filled 2016-09-27: qty 1

## 2016-09-27 MED ORDER — LACTATED RINGERS IV SOLN
INTRAVENOUS | Status: DC
  Administered 2016-09-27: 07:00:00 via INTRAVENOUS

## 2016-09-27 MED ORDER — KETOROLAC TROMETHAMINE 30 MG/ML IJ SOLN
INTRAMUSCULAR | Status: AC
  Filled 2016-09-27: qty 1

## 2016-09-27 MED ORDER — FENTANYL CITRATE (PF) 250 MCG/5ML IJ SOLN
INTRAMUSCULAR | Status: AC
  Filled 2016-09-27: qty 5

## 2016-09-27 MED ORDER — HYDROMORPHONE HCL 1 MG/ML IJ SOLN: 0.2500 mg | INTRAMUSCULAR | Status: DC | PRN

## 2016-09-27 MED ORDER — ROPIVACAINE HCL 5 MG/ML IJ SOLN
INTRAMUSCULAR | Status: AC
  Filled 2016-09-27: qty 30

## 2016-09-27 MED ORDER — PROPOFOL 10 MG/ML IV BOLUS
INTRAVENOUS | Status: DC | PRN
  Administered 2016-09-27: 30 mg via INTRAVENOUS
  Administered 2016-09-27: 120 mg via INTRAVENOUS

## 2016-09-27 MED ORDER — SCOPOLAMINE 1 MG/3DAYS TD PT72
1.0000 | MEDICATED_PATCH | Freq: Once | TRANSDERMAL | Status: DC
  Administered 2016-09-27: 1.5 mg via TRANSDERMAL

## 2016-09-27 MED ORDER — DEXAMETHASONE SODIUM PHOSPHATE 10 MG/ML IJ SOLN
INTRAMUSCULAR | Status: DC | PRN
  Administered 2016-09-27: 8 mg via INTRAVENOUS

## 2016-09-27 MED ORDER — OXYCODONE-ACETAMINOPHEN 5-325 MG PO TABS
1.0000 | ORAL_TABLET | ORAL | Status: DC | PRN
  Administered 2016-09-27: 1 via ORAL

## 2016-09-27 SURGICAL SUPPLY — 26 items
CATH ROBINSON RED A/P 16FR (CATHETERS) ×4 IMPLANT
CLIP FILSHIE TUBAL LIGA STRL (Clip) ×2 IMPLANT
CLOTH BEACON ORANGE TIMEOUT ST (SAFETY) ×2 IMPLANT
DERMABOND ADVANCED (GAUZE/BANDAGES/DRESSINGS) ×1
DERMABOND ADVANCED .7 DNX12 (GAUZE/BANDAGES/DRESSINGS) ×1 IMPLANT
DRSG OPSITE POSTOP 3X4 (GAUZE/BANDAGES/DRESSINGS) ×2 IMPLANT
DURAPREP 26ML APPLICATOR (WOUND CARE) ×2 IMPLANT
GLOVE BIO SURGEON STRL SZ7 (GLOVE) ×4 IMPLANT
GLOVE BIOGEL PI IND STRL 7.0 (GLOVE) ×1 IMPLANT
GLOVE BIOGEL PI INDICATOR 7.0 (GLOVE) ×1
GOWN STRL REUS W/TWL LRG LVL3 (GOWN DISPOSABLE) ×6 IMPLANT
NEEDLE INSUFFLATION 120MM (ENDOMECHANICALS) ×2 IMPLANT
PACK LAPAROSCOPY BASIN (CUSTOM PROCEDURE TRAY) ×2 IMPLANT
PACK TRENDGUARD 450 HYBRID PRO (MISCELLANEOUS) ×1 IMPLANT
PACK TRENDGUARD 600 HYBRD PROC (MISCELLANEOUS) IMPLANT
PROTECTOR NERVE ULNAR (MISCELLANEOUS) ×2 IMPLANT
SUT VIC AB 2-0 UR6 27 (SUTURE) ×2 IMPLANT
SUT VICRYL RAPIDE 3 0 (SUTURE) IMPLANT
SYR 50ML LL SCALE MARK (SYRINGE) ×2 IMPLANT
SYR 5ML LL (SYRINGE) ×2 IMPLANT
TOWEL OR 17X24 6PK STRL BLUE (TOWEL DISPOSABLE) ×4 IMPLANT
TRENDGUARD 450 HYBRID PRO PACK (MISCELLANEOUS) ×2
TRENDGUARD 600 HYBRID PROC PK (MISCELLANEOUS)
TROCAR OPTI TIP 5M 100M (ENDOMECHANICALS) IMPLANT
TROCAR XCEL DIL TIP R 11M (ENDOMECHANICALS) ×2 IMPLANT
WARMER LAPAROSCOPE (MISCELLANEOUS) ×2 IMPLANT

## 2016-09-27 NOTE — Anesthesia Postprocedure Evaluation (Signed)
Anesthesia Post Note  Patient: Shelby Navarro  Procedure(s) Performed: Procedure(s) (LRB): LAPAROSCOPIC TUBAL LIGATION (Bilateral)     Patient location during evaluation: PACU Anesthesia Type: General Level of consciousness: sedated Pain management: pain level controlled Vital Signs Assessment: post-procedure vital signs reviewed and stable Respiratory status: spontaneous breathing and respiratory function stable Cardiovascular status: stable Anesthetic complications: no    Last Vitals:  Vitals:   09/27/16 1030 09/27/16 1117  BP:  122/60  Pulse: 69 77  Resp: 13 16  Temp: 37.2 C 37.2 C  SpO2: 99% 100%    Last Pain:  Vitals:   09/27/16 1117  TempSrc:   PainSc: 5    Pain Goal: Patients Stated Pain Goal: 3 (09/27/16 1117)               [place]

## 2016-09-27 NOTE — Progress Notes (Signed)
There has been no change in the patients history, status or exam since the history and physical.  Vitals:   09/27/16 0708  BP: 115/73  Pulse: 92  Resp: 18  Temp: 97.9 F (36.6 C)  TempSrc: Oral  SpO2: 100%    Lab Results  Component Value Date   WBC 4.5 09/21/2016   HGB 12.8 09/21/2016   HCT 38.0 09/21/2016   MCV 92.5 09/21/2016   PLT 300 09/21/2016   Results for orders placed or performed during the hospital encounter of 09/27/16 (from the past 24 hour(s))  Pregnancy, urine     Status: None   Collection Time: 09/27/16  7:00 AM  Result Value Ref Range   Preg Test, Ur NEGATIVE NEGATIVE    Emile Kyllo A

## 2016-09-27 NOTE — Transfer of Care (Signed)
Immediate Anesthesia Transfer of Care Note  Patient: Shelby Navarro  Procedure(s) Performed: Procedure(s) with comments: LAPAROSCOPIC TUBAL LIGATION (Bilateral) - WITH FILSHIE CLIPS  Patient Location: PACU  Anesthesia Type:General  Level of Consciousness: awake, alert , oriented and patient cooperative  Airway & Oxygen Therapy: Patient Spontanous Breathing and Patient connected to nasal cannula oxygen  Post-op Assessment: Report given to RN and Post -op Vital signs reviewed and stable  Post vital signs: Reviewed and stable  Last Vitals:  Vitals:   09/27/16 0708  BP: 115/73  Pulse: 92  Resp: 18  Temp: 36.6 C  SpO2: 100%    Last Pain:  Vitals:   09/27/16 0708  TempSrc: Oral      Patients Stated Pain Goal: 3 (36/46/80 3212)  Complications: No apparent anesthesia complications

## 2016-09-27 NOTE — Anesthesia Procedure Notes (Signed)
Procedure Name: Intubation Date/Time: 09/27/2016 8:34 AM Performed by: Georgeanne Nim Pre-anesthesia Checklist: Patient identified, Emergency Drugs available, Suction available, Patient being monitored and Timeout performed Patient Re-evaluated:Patient Re-evaluated prior to induction Oxygen Delivery Method: Circle system utilized Preoxygenation: Pre-oxygenation with 100% oxygen Induction Type: IV induction Ventilation: Mask ventilation without difficulty Laryngoscope Size: Mac and 3 Grade View: Grade I Tube type: Oral Tube size: 7.0 mm Number of attempts: 1 Airway Equipment and Method: Stylet Placement Confirmation: ETT inserted through vocal cords under direct vision,  positive ETCO2,  CO2 detector and breath sounds checked- equal and bilateral Secured at: 20 cm Tube secured with: Tape Dental Injury: Teeth and Oropharynx as per pre-operative assessment

## 2016-09-27 NOTE — Op Note (Signed)
09/27/2016  9:00 AM  PATIENT:  Shelby Navarro  36 y.o. female  PRE-OPERATIVE DIAGNOSIS:  DESIRES STERILIZATION  POST-OPERATIVE DIAGNOSIS:  DESIRES STERILIZATION  PROCEDURE:  Procedure(s) with comments: LAPAROSCOPIC TUBAL LIGATION (Bilateral) - WITH FILSHIE CLIPS  SURGEON:  Surgeon(s) and Role:    * Bobbye Charleston, MD - Primary    ANESTHESIA:   general  EBL:  No intake/output data recorded.  LOCAL MEDICATIONS USED:  OTHER Ropivicaine  SPECIMEN:  No Specimen  DISPOSITION OF SPECIMEN:  N/A  COUNTS:  YES  TOURNIQUET:  * No tourniquets in log *  DICTATION: .Note written in EPIC  PLAN OF CARE: Discharge to home after PACU  PATIENT DISPOSITION:  PACU - hemodynamically stable.   Delay start of Pharmacological VTE agent (>24hrs) due to surgical blood loss or risk of bleeding: not applicable  Complications:  none  Findings: bulky uterus with multiple fibroids.  Filmy adhesion of R broad ligament to both uterus and appendix but appendix otherwise free and normal appearing.  Normal ovaries.    Technique  After adequate general endotracheal anesthesia was achieved, the patient was prepped and draped in the usual sterile fashion.  A 2 cm incision was made just below the umbilicus and the abdominal wall tented up.  The Veress needle was inserted at a 45 degree angle to the pelvis and no bowel contents or blood were aspirated.  The abdomen was insufflated and the 12 mm trocar placed without complication.  The operative scope was introduced and the above findings noted.  The filchie clip instrument was introduced and the tube were followed to their fimbriated ends bilaterally.  A filchie clip was placed on the isthmic portion of each tube and confirmed visually to be around the entire circumference of each tube.  After a quick inspection of the pelvis and liver edge, all instruments were removed and the abdomen was desufflated.  The 12 mm faschial incision was closed with a figure  of eight stitch of 2-vicryl and the skin was closed with dermabond.  All instruments were removed from the vagina and the patient returned to the PACU in stable condition.  Henry Utsey A

## 2016-09-27 NOTE — Discharge Instructions (Addendum)
DISCHARGE INSTRUCTIONS: Laparoscopy  The following instructions have been prepared to help you care for yourself upon your return home today.  Wound care:  Do not get the incision wet for the first 24 hours. The incision should be kept clean and dry.  The Band-Aids or dressings may be removed the Day after surgery.  Should the incision become sore, red, and swollen after the first week, check with your doctor.  Personal hygiene:  Shower the day after your procedure.  Activity and limitations:  Do NOT drive or operate any equipment today.  Do NOT lift anything more than 15 pounds for 2-3 weeks after surgery.  Do NOT rest in bed all day.  Walking is encouraged. Walk each day, starting slowly with 5-minute walks 3 or 4 times a day. Slowly increase the length of your walks.  Walk up and down stairs slowly.  Do NOT do strenuous activities, such as golfing, playing tennis, bowling, running, biking, weight lifting, gardening, mowing, or vacuuming for 2-4 weeks. Ask your doctor when it is okay to start.  Diet: Eat a light meal as desired this evening. You may resume your usual diet tomorrow.  Return to work: This is dependent on the type of work you do. For the most part you can return to a desk job within a week of surgery. If you are more active at work, please discuss this with your doctor.  What to expect after your surgery: You may have a slight burning sensation when you urinate on the first day. You may have a very small amount of blood in the urine. Expect to have a small amount of vaginal discharge/light bleeding for 1-2 weeks. It is not unusual to have abdominal soreness and bruising for up to 2 weeks. You may be tired and need more rest for about 1 week. You may experience shoulder pain for 24-72 hours. Lying flat in bed may relieve it.  Call your doctor for any of the following:  Develop a fever of 100.4 or greater  Inability to urinate 6 hours after discharge from  hospital  Severe pain not relieved by pain medications  Persistent of heavy bleeding at incision site  Redness or swelling around incision site after a week  Increasing nausea or vomiting  Patient Signature________________________________________ Nurse Signature_________________________________________   Post Anesthesia Home Care Instructions  NO IBUPROFEN PRODUCTS UNTIL: 3 PM TODAY  Activity: Get plenty of rest for the remainder of the day. A responsible individual must stay with you for 24 hours following the procedure.  For the next 24 hours, DO NOT: -Drive a car -Paediatric nurse -Drink alcoholic beverages -Take any medication unless instructed by your physician -Make any legal decisions or sign important papers.  Meals: Start with liquid foods such as gelatin or soup. Progress to regular foods as tolerated. Avoid greasy, spicy, heavy foods. If nausea and/or vomiting occur, drink only clear liquids until the nausea and/or vomiting subsides. Call your physician if vomiting continues.  Special Instructions/Symptoms: Your throat may feel dry or sore from the anesthesia or the breathing tube placed in your throat during surgery. If this causes discomfort, gargle with warm salt water. The discomfort should disappear within 24 hours.  If you had a scopolamine patch placed behind your ear for the management of post- operative nausea and/or vomiting:  1. The medication in the patch is effective for 72 hours, after which it should be removed.  Wrap patch in a tissue and discard in the trash. Wash hands thoroughly  with soap and water. 2. You may remove the patch earlier than 72 hours if you experience unpleasant side effects which may include dry mouth, dizziness or visual disturbances. 3. Avoid touching the patch. Wash your hands with soap and water after contact with the patch.

## 2016-09-27 NOTE — Brief Op Note (Signed)
09/27/2016  9:00 AM  PATIENT:  Shelby Navarro  36 y.o. female  PRE-OPERATIVE DIAGNOSIS:  DESIRES STERILIZATION  POST-OPERATIVE DIAGNOSIS:  DESIRES STERILIZATION  PROCEDURE:  Procedure(s) with comments: LAPAROSCOPIC TUBAL LIGATION (Bilateral) - WITH FILSHIE CLIPS  SURGEON:  Surgeon(s) and Role:    * Bobbye Charleston, MD - Primary    ANESTHESIA:   general  EBL:  No intake/output data recorded.  LOCAL MEDICATIONS USED:  OTHER Ropivicaine  SPECIMEN:  No Specimen  DISPOSITION OF SPECIMEN:  N/A  COUNTS:  YES  TOURNIQUET:  * No tourniquets in log *  DICTATION: .Note written in EPIC  PLAN OF CARE: Discharge to home after PACU  PATIENT DISPOSITION:  PACU - hemodynamically stable.   Delay start of Pharmacological VTE agent (>24hrs) due to surgical blood loss or risk of bleeding: not applicable  Complications:  none  Findings: bulky uterus with multiple fibroids.  Filmy adhesion of R broad ligament to both uterus and appendix but appendix otherwise free and normal appearing.  Normal ovaries.    Technique  After adequate general endotracheal anesthesia was achieved, the patient was prepped and draped in the usual sterile fashion.  A 2 cm incision was made just below the umbilicus and the abdominal wall tented up.  The Veress needle was inserted at a 45 degree angle to the pelvis and no bowel contents or blood were aspirated.  The abdomen was insufflated and the 12 mm trocar placed without complication.  The operative scope was introduced and the above findings noted.  The filchie clip instrument was introduced and the tube were followed to their fimbriated ends bilaterally.  A filchie clip was placed on the isthmic portion of each tube and confirmed visually to be around the entire circumference of each tube.  After a quick inspection of the pelvis and liver edge, all instruments were removed and the abdomen was desufflated.  The 12 mm faschial incision was closed with a figure  of eight stitch of 2-vicryl and the skin was closed with dermabond.  All instruments were removed from the vagina and the patient returned to the PACU in stable condition.  Heike Pounds A

## 2016-09-28 ENCOUNTER — Encounter (HOSPITAL_COMMUNITY): Payer: Self-pay | Admitting: Obstetrics and Gynecology

## 2017-06-29 DIAGNOSIS — Z124 Encounter for screening for malignant neoplasm of cervix: Secondary | ICD-10-CM | POA: Diagnosis not present

## 2017-06-29 DIAGNOSIS — R87612 Low grade squamous intraepithelial lesion on cytologic smear of cervix (LGSIL): Secondary | ICD-10-CM | POA: Diagnosis not present

## 2017-06-29 DIAGNOSIS — Z113 Encounter for screening for infections with a predominantly sexual mode of transmission: Secondary | ICD-10-CM | POA: Diagnosis not present

## 2017-06-29 DIAGNOSIS — Z01419 Encounter for gynecological examination (general) (routine) without abnormal findings: Secondary | ICD-10-CM | POA: Diagnosis not present

## 2017-06-29 DIAGNOSIS — Z0000 Encounter for general adult medical examination without abnormal findings: Secondary | ICD-10-CM | POA: Diagnosis not present

## 2017-08-03 DIAGNOSIS — R87612 Low grade squamous intraepithelial lesion on cytologic smear of cervix (LGSIL): Secondary | ICD-10-CM | POA: Diagnosis not present

## 2017-09-18 MED FILL — VALACYCLOVIR HCL 500 MG TAB: 500 | 90 days supply | Qty: 90 | Fill #0

## 2017-10-01 DIAGNOSIS — H5213 Myopia, bilateral: Secondary | ICD-10-CM | POA: Diagnosis not present

## 2017-10-24 MED FILL — FLUCONAZOLE 150 MG TABS: 150 | 4 days supply | Qty: 2 | Fill #0

## 2017-11-01 DIAGNOSIS — N760 Acute vaginitis: Secondary | ICD-10-CM | POA: Diagnosis not present

## 2017-11-01 DIAGNOSIS — Z113 Encounter for screening for infections with a predominantly sexual mode of transmission: Secondary | ICD-10-CM | POA: Diagnosis not present

## 2018-02-14 DIAGNOSIS — R87612 Low grade squamous intraepithelial lesion on cytologic smear of cervix (LGSIL): Secondary | ICD-10-CM | POA: Diagnosis not present

## 2018-02-19 MED FILL — VALACYCLOVIR HCL 500 MG TAB: 500 | 90 days supply | Qty: 90 | Fill #1

## 2018-02-26 MED FILL — FLUCONAZOLE 150 MG TABS: 150 | 3 days supply | Qty: 2 | Fill #0

## 2018-07-18 DIAGNOSIS — Z124 Encounter for screening for malignant neoplasm of cervix: Secondary | ICD-10-CM | POA: Diagnosis not present

## 2018-07-18 DIAGNOSIS — Z01419 Encounter for gynecological examination (general) (routine) without abnormal findings: Secondary | ICD-10-CM | POA: Diagnosis not present

## 2018-07-18 MED FILL — IBUPROFEN 800 MG TAB: 800 | 33 days supply | Qty: 100 | Fill #0

## 2018-07-18 MED FILL — CLINDAMYCIN PH 1% GEL: 1 | 30 days supply | Qty: 60 | Fill #0

## 2019-03-27 ENCOUNTER — Encounter (HOSPITAL_COMMUNITY): Payer: Self-pay

## 2019-03-27 ENCOUNTER — Emergency Department (HOSPITAL_COMMUNITY)
Admission: EM | Admit: 2019-03-27 | Discharge: 2019-03-27 | Disposition: A | Discharge status: Home / Self Care | Payer: No Typology Code available for payment source | Attending: Emergency Medicine | Admitting: Emergency Medicine

## 2019-03-27 ENCOUNTER — Emergency Department (HOSPITAL_COMMUNITY): Payer: No Typology Code available for payment source

## 2019-03-27 DIAGNOSIS — Y998 Other external cause status: Secondary | ICD-10-CM | POA: Insufficient documentation

## 2019-03-27 DIAGNOSIS — Y929 Unspecified place or not applicable: Secondary | ICD-10-CM | POA: Insufficient documentation

## 2019-03-27 DIAGNOSIS — S5012XA Contusion of left forearm, initial encounter: Secondary | ICD-10-CM | POA: Diagnosis not present

## 2019-03-27 DIAGNOSIS — S40022A Contusion of left upper arm, initial encounter: Secondary | ICD-10-CM | POA: Diagnosis not present

## 2019-03-27 DIAGNOSIS — S7002XA Contusion of left hip, initial encounter: Secondary | ICD-10-CM | POA: Diagnosis not present

## 2019-03-27 DIAGNOSIS — Y9301 Activity, walking, marching and hiking: Secondary | ICD-10-CM | POA: Insufficient documentation

## 2019-03-27 DIAGNOSIS — W19XXXA Unspecified fall, initial encounter: Secondary | ICD-10-CM | POA: Insufficient documentation

## 2019-03-27 DIAGNOSIS — S79912A Unspecified injury of left hip, initial encounter: Secondary | ICD-10-CM | POA: Diagnosis not present

## 2019-03-27 DIAGNOSIS — M79603 Pain in arm, unspecified: Secondary | ICD-10-CM | POA: Diagnosis not present

## 2019-03-27 DIAGNOSIS — R52 Pain, unspecified: Secondary | ICD-10-CM | POA: Diagnosis not present

## 2019-03-27 DIAGNOSIS — Z79899 Other long term (current) drug therapy: Secondary | ICD-10-CM | POA: Diagnosis not present

## 2019-03-27 NOTE — ED Notes (Signed)
  Patient advised that she needed a pregnancy test to get her x-rays.  Patient states she had tubal ligation in 2017 and is not sexually active.  Patient politely refuses pregnancy test.  Dr. Betsey Holiday and Rad tech Lauren notified.

## 2019-03-27 NOTE — ED Provider Notes (Signed)
[facility] EMERGENCY DEPARTMENT Provider Note   CSN: [id number] Arrival date & time: 03/27/19  0405     History Chief Complaint  Patient presents with  . Fall    Shelby Navarro is a 39 y.o. female.  Patient presents to the emergency department for evaluation after a fall.  Patient had a ground-level fall while walking.  She landed on her left side, predominantly on the left arm.  She did not hit her head or lose consciousness.  No neck pain, back pain, headache.  Patient complaining of pain from the upper arm down to the wrist as well as left hip pain.        Past Medical History:  Diagnosis Date  . Alopecia   . Fatigue   . Headache   . Multiple sclerosis ([place]) 10/2008    Patient Active Problem List   Diagnosis Date Noted  . Chronic tachycardia 04/02/2013  . Allergic to food 02/18/2013  . Multiple sclerosis ([place]) 02/14/2013  . Paresthesia 02/14/2013  . Weakness of right side of body 02/14/2013  . Allergic rhinitis 02/14/2013    Past Surgical History:  Procedure Laterality Date  . LAPAROSCOPIC TUBAL LIGATION Bilateral 09/27/2016   Procedure: LAPAROSCOPIC TUBAL LIGATION;  Surgeon: Bobbye Charleston, MD;  Location: [facility];  Service: Gynecology;  Laterality: Bilateral;  WITH FILSHIE CLIPS  . OVARY SURGERY       OB History   No obstetric history on file.     Family History  Problem Relation Age of Onset  . Hypertension Mother   . Healthy Father   . Lupus Sister   . Colon cancer Paternal Aunt   . Healthy Son   . Healthy Daughter     Social History   Tobacco Use  . Smoking status: Never Smoker  . Smokeless tobacco: Never Used  Substance Use Topics  . Alcohol use: No  . Drug use: No    Home Medications Prior to Admission medications   Medication Sig Start Date End Date Taking? Authorizing Provider  cephALEXin (KEFLEX) 500 MG capsule Take 1 capsule (500 mg total) by mouth 2 (two) times daily. Patient not taking: Reported on  09/20/2016 09/17/15   Bjorn Pippin, PA-C  Cholecalciferol (VITAMIN D) 2000 UNITS tablet Take 1 tablet (2,000 Units total) by mouth daily. Patient not taking: Reported on 09/20/2016 03/28/13   Plotnikov, Evie Lacks, MD  loratadine (CLARITIN) 10 MG tablet Take 1 tablet (10 mg total) by mouth daily. Patient not taking: Reported on 09/20/2016 02/14/13   Plotnikov, Evie Lacks, MD  norgestimate-ethinyl estradiol (ORTHO-CYCLEN,SPRINTEC,PREVIFEM) 0.25-35 MG-MCG tablet Take 1 tablet by mouth daily.    [provider]  oxyCODONE-acetaminophen (ROXICET) 5-325 MG tablet Take 1 tablet by mouth every 4 (four) hours as needed for severe pain. 09/27/16   Bobbye Charleston, MD  vitamin C (ASCORBIC ACID) 500 MG tablet Take 500 mg by mouth daily.    [provider]    Allergies    Patient has no known allergies.  Review of Systems   Review of Systems  Musculoskeletal: Positive for arthralgias. Negative for back pain and neck pain.  Neurological: Negative for syncope and headaches.  All other systems reviewed and are negative.   Physical Exam Updated Vital Signs BP 118/69 (BP Location: Right Arm)   Pulse 76   Temp 97.6 F (36.4 C) (Oral)   Resp 18   Ht 5\' 4"  (1.626 m)   Wt 59 kg   SpO2 100%   BMI  22.31 kg/m   Physical Exam Vitals and nursing note reviewed.  Constitutional:      General: She is not in acute distress.    Appearance: Normal appearance. She is well-developed.  HENT:     Head: Normocephalic and atraumatic.     Right Ear: Hearing normal.     Left Ear: Hearing normal.     Nose: Nose normal.  Eyes:     Conjunctiva/sclera: Conjunctivae normal.     Pupils: Pupils are equal, round, and reactive to light.  Cardiovascular:     Rate and Rhythm: Regular rhythm.     Heart sounds: S1 normal and S2 normal. No murmur. No friction rub. No gallop.   Pulmonary:     Effort: Pulmonary effort is normal. No respiratory distress.     Breath sounds: Normal breath sounds.  Chest:      Chest wall: No tenderness.  Abdominal:     General: Bowel sounds are normal.     Palpations: Abdomen is soft.     Tenderness: There is no abdominal tenderness. There is no guarding or rebound. Negative signs include Murphy's sign and McBurney's sign.     Hernia: No hernia is present.  Musculoskeletal:        General: Normal range of motion.     Left shoulder: Normal.     Left upper arm: Tenderness present. No deformity.     Left forearm: Tenderness present. No deformity.     Cervical back: Normal range of motion and neck supple.  Skin:    General: Skin is warm and dry.     Findings: No rash.  Neurological:     Mental Status: She is alert and oriented to person, place, and time.     GCS: GCS eye subscore is 4. GCS verbal subscore is 5. GCS motor subscore is 6.     Cranial Nerves: No cranial nerve deficit.     Sensory: No sensory deficit.     Coordination: Coordination normal.  Psychiatric:        Speech: Speech normal.        Behavior: Behavior normal.        Thought Content: Thought content normal.     ED Results / Procedures / Treatments   Labs (all labs ordered are listed, but only abnormal results are displayed) Labs Reviewed - No data to display  EKG None  Radiology DG Forearm Left  Result Date: 03/27/2019 CLINICAL DATA:  Fall onto left arm with bruising. EXAM: LEFT FOREARM - 2 VIEW COMPARISON:  None. FINDINGS: There is no evidence of fracture or other focal bone lesions. Soft tissues are unremarkable. IMPRESSION: Negative. Electronically Signed   By: Monte Fantasia M.D.   On: 03/27/2019 05:10   DG Humerus Left  Result Date: 03/27/2019 CLINICAL DATA:  Fall onto left arm with bruising EXAM: LEFT HUMERUS - 2+ VIEW COMPARISON:  None. FINDINGS: There is no evidence of fracture or other focal bone lesions. Soft tissues are unremarkable. IMPRESSION: Negative. Electronically Signed   By: Monte Fantasia M.D.   On: 03/27/2019 05:11   DG Hip Unilat W or Wo Pelvis 2-3 Views  Left  Result Date: 03/27/2019 CLINICAL DATA:  [place]. Left hip pain. EXAM: DG HIP (WITH OR WITHOUT PELVIS) 2-3V LEFT COMPARISON:  None. FINDINGS: Both hips are normally located. No acute hip fracture or plain film findings for AVN. The pubic symphysis and SI joints are intact. No pelvic fractures or bone lesions. Tubal ligation clips are noted in the  pelvis. IMPRESSION: No acute bony findings. Electronically Signed   By: Marijo Sanes M.D.   On: 03/27/2019 05:12    Procedures Procedures (including critical care time)  Medications Ordered in ED Medications - No data to display  ED Course  I have reviewed the triage vital signs and the nursing notes.  Pertinent labs & imaging results that were available during my care of the patient were reviewed by me and considered in my medical decision making (see chart for details).    MDM Rules/Calculators/A&P                      Patient presents to the emergency department for evaluation of injuries after a fall.  Patient complaining of diffuse pain of the left arm and the left hip.  She did not hit her head, no loss of consciousness.  She denies neck pain.  She is not experiencing any midline back pain.  X-ray of arm and hip obtained, no fractures noted.  Final Clinical Impression(s) / ED Diagnoses Final diagnoses:  Contusion of left hip, initial encounter  Contusion of left upper extremity, initial encounter    Rx / DC Orders ED Discharge Orders    None       Aldene Hendon, Gwenyth Allegra, MD 03/28/19 [phone]

## 2019-03-27 NOTE — ED Triage Notes (Signed)
Patient bib[facility] after falling onto left arm.  Ambulated after fall without difficulty.  Pain and bruising from left elbow to left hand.  Some pain to left hip and side.

## 2021-07-20 IMAGING — DX DG HIP (WITH OR WITHOUT PELVIS) 2-3V*L*
3 series · 3 of 3 positions shown · non-contrast
Comparison: None.

CLINICAL DATA: Fell. Left hip pain.

EXAM:
DG HIP (WITH OR WITHOUT PELVIS) 2-3V LEFT

[pelvis ap]
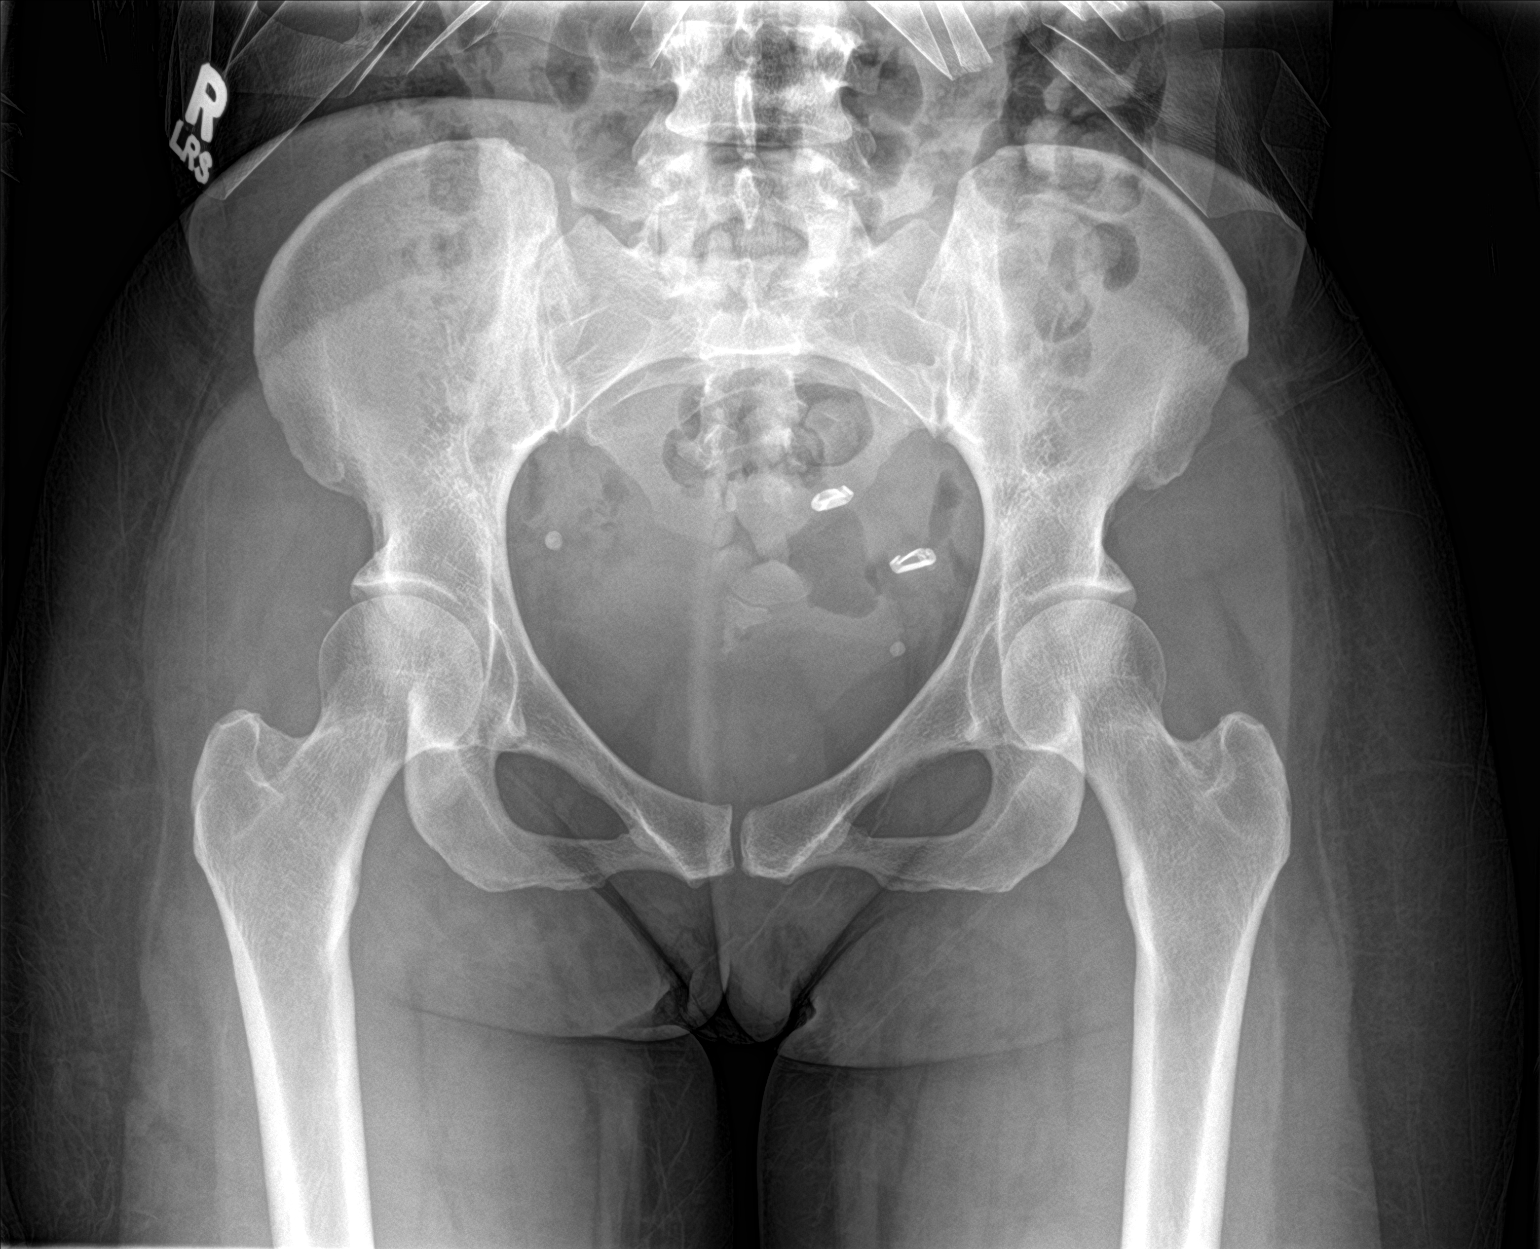

[hip ap]
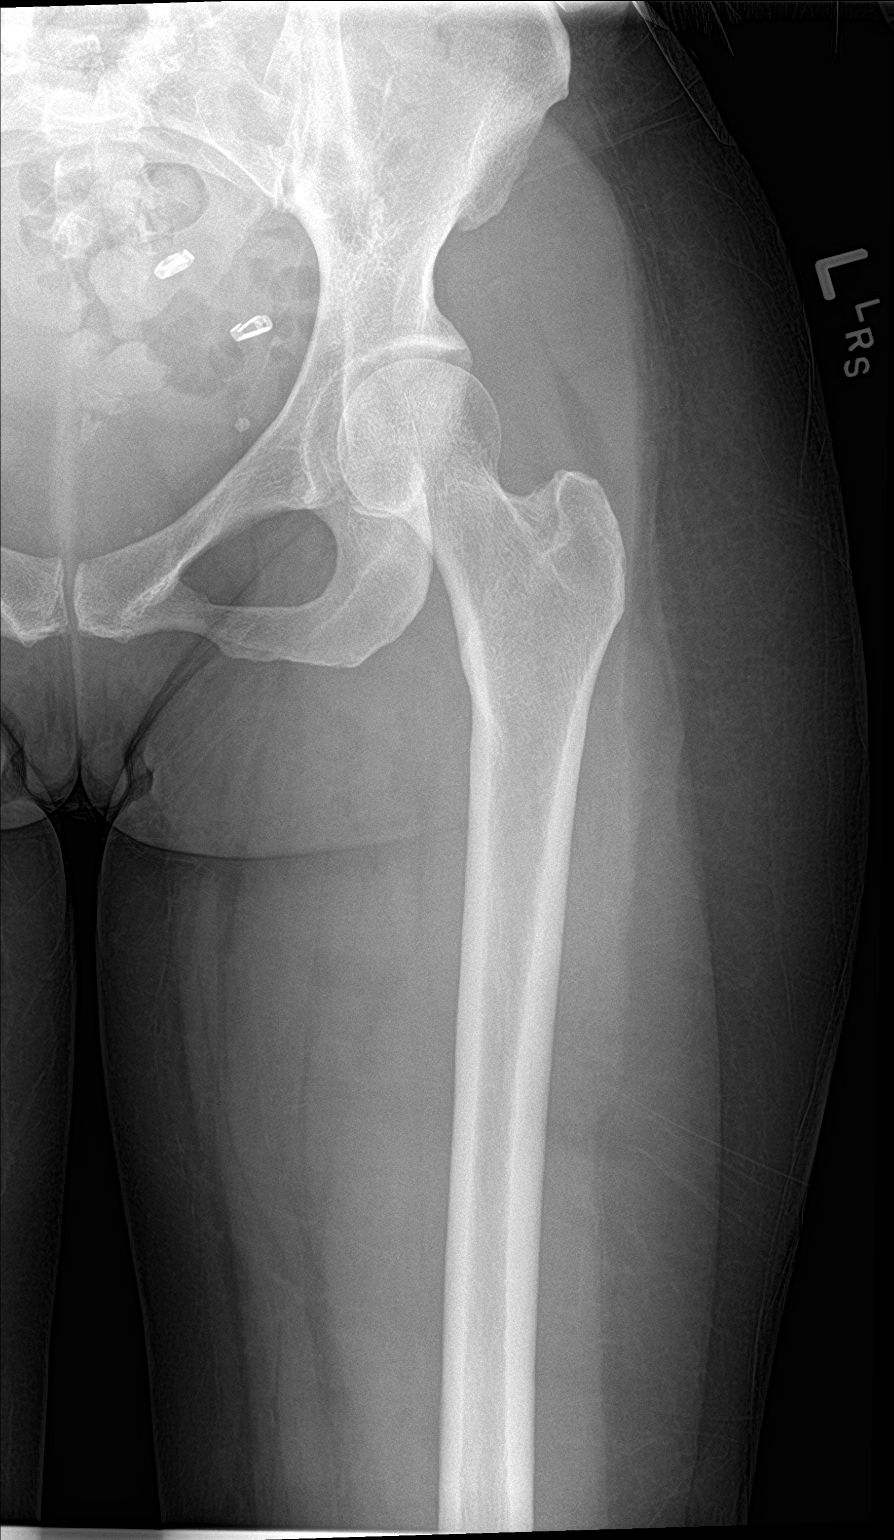

[hip lat]
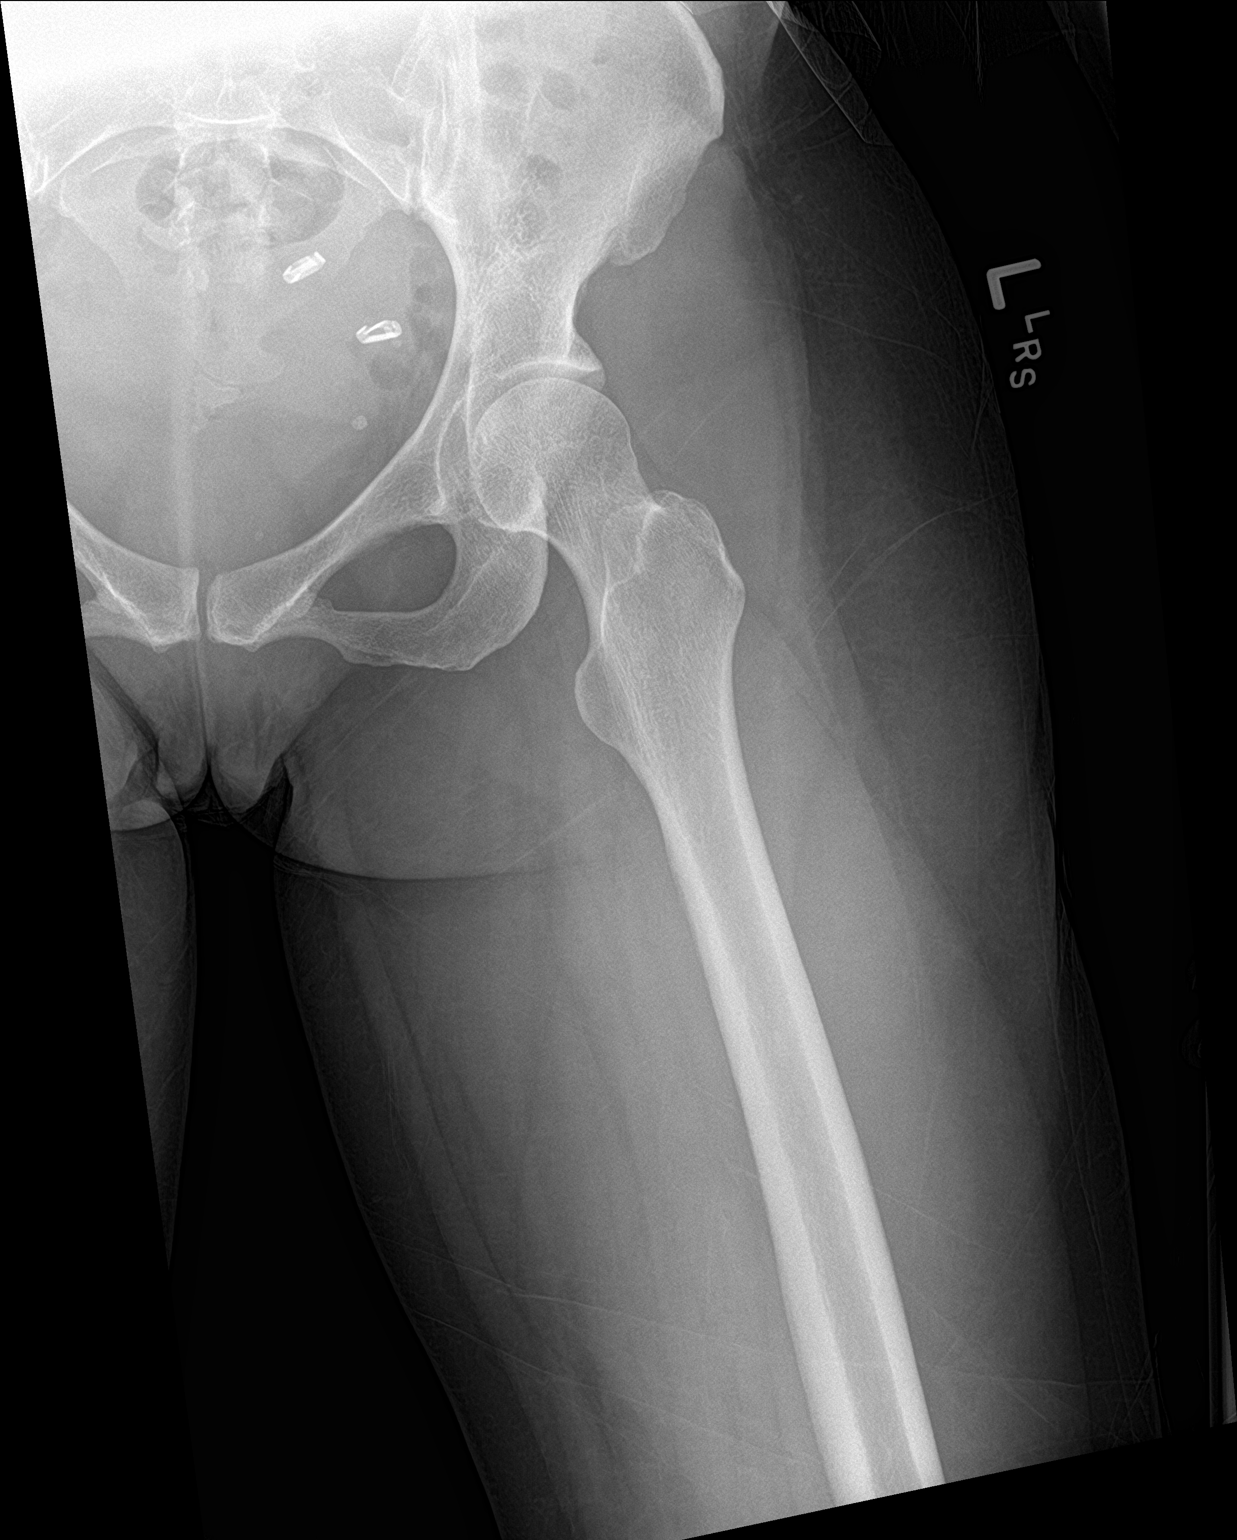

[3 of 3 positions shown; findings below may reference images not displayed]

FINDINGS: Both hips are normally located. No acute hip fracture or plain film
findings for AVN. The pubic symphysis and SI joints are intact. No
pelvic fractures or bone lesions. Tubal ligation clips are noted in
the pelvis.
IMPRESSION: No acute bony findings.
